# Patient Record
Sex: Female | Born: 1975 | Race: White | Hispanic: No | Marital: Married
Health system: Southern US, Community
[De-identification: ages and names within clinical notes are randomized; demographics above are authoritative.]

---

## 2008-08-20 ENCOUNTER — Ambulatory Visit: Payer: Self-pay | Admitting: Occupational Medicine

## 2008-08-20 DIAGNOSIS — S63509A Unspecified sprain of unspecified wrist, initial encounter: Secondary | ICD-10-CM | POA: Insufficient documentation

## 2009-11-14 ENCOUNTER — Ambulatory Visit: Payer: Self-pay | Admitting: Family Medicine

## 2009-11-14 DIAGNOSIS — M7710 Lateral epicondylitis, unspecified elbow: Secondary | ICD-10-CM | POA: Insufficient documentation

## 2009-11-14 DIAGNOSIS — M25529 Pain in unspecified elbow: Secondary | ICD-10-CM | POA: Insufficient documentation

## 2009-11-14 DIAGNOSIS — M7700 Medial epicondylitis, unspecified elbow: Secondary | ICD-10-CM

## 2010-05-28 NOTE — Assessment & Plan Note (Signed)
Summary: R elbow, arm, hand, shoulder x 2 wks rm 3   Vital Signs:  Patient Profile:   35 Years Old Female CC:      R elbow,arm shoulder, hand pain x 2 wks Height:     67 inches Weight:      195 pounds O2 Sat:      100 % O2 treatment:    Room Air Temp:     97.6 degrees F oral Pulse rate:   69 / minute Pulse rhythm:   regular Resp:     16 per minute BP sitting:   134 / 88  (right arm) Cuff size:   regular  Pt. in pain?   yes  Vitals Entered By: Areta Haber CMA (November 14, 2009 1:33 PM)                   Current Allergies: No known allergies History of Present Illness Chief Complaint: R elbow,arm shoulder, hand pain x 2 wks History of Present Illness:  Subjective:  Patient complains of falling on her right elbow two months ago.  She had no pain initially, but over the past two weeks has had increasing pain in the right elbow.  She uses a keyboard and mouse, and now has pain with this activity despite wearing a tennis elbow strap.  The pain awakens her at night.  Current Problems: MEDIAL EPICONDYLITIS, RIGHT (ICD-726.31) LATERAL EPICONDYLITIS, RIGHT (ICD-726.32) ELBOW PAIN, RIGHT (ICD-719.42) SPRAIN AND STRAIN OF UNSPECIFIED SITE OF WRIST (ICD-842.00) FAMILY HISTORY DIABETES 1ST DEGREE RELATIVE (ICD-V18.0)   Current Meds * NONE  IBUPROFEN 200 MG TABS (IBUPROFEN) as needed for pain NAPROXEN 500 MG TABS (NAPROXEN) One by mouth two times a day pc LORTAB 5 5-500 MG TABS (HYDROCODONE-ACETAMINOPHEN) One or two tabs by mouth hs as needed pain  REVIEW OF SYSTEMS Constitutional Symptoms      Denies fever, chills, night sweats, weight loss, weight gain, and fatigue.  Eyes       Denies change in vision, eye pain, eye discharge, glasses, contact lenses, and eye surgery. Ear/Nose/Throat/Mouth       Denies hearing loss/aids, change in hearing, ear pain, ear discharge, dizziness, frequent runny nose, frequent nose bleeds, sinus problems, sore throat, hoarseness, and tooth pain  or bleeding.  Respiratory       Denies dry cough, productive cough, wheezing, shortness of breath, asthma, bronchitis, and emphysema/COPD.  Cardiovascular       Denies murmurs, chest pain, and tires easily with exhertion.    Gastrointestinal       Denies stomach pain, nausea/vomiting, diarrhea, constipation, blood in bowel movements, and indigestion. Genitourniary       Denies painful urination, kidney stones, and loss of urinary control. Neurological       Denies paralysis, seizures, and fainting/blackouts. Musculoskeletal       Denies muscle pain, joint pain, joint stiffness, decreased range of motion, redness, swelling, muscle weakness, and gout.      Comments: R elbow, arm, shoulder, hand pain x 2 wks Skin       Denies bruising, unusual mles/lumps or sores, and hair/skin or nail changes.  Psych       Denies mood changes, temper/anger issues, anxiety/stress, speech problems, depression, and sleep problems. Other Comments: Pt states she can not get into Orthopedist x 2wks and needs something for pain. Pt states she fell in daycare parking lot x 2 months but never went to be seen for anything.    Past History:  Past Medical History:  Last updated: 08/20/2008 ovarian cyst  Past Surgical History: Last updated: 08/20/2008 Caesarean section  Family History: Last updated: 08/20/2008 Family History Diabetes 1st degree relative  Social History: Last updated: 08/20/2008 Occupation:Operations manager Never Smoked Alcohol use-no Drug use-no  Risk Factors: Smoking Status: never (08/20/2008)   Objective:  No acute distress  Right elbow:  No swelling or deformity.  Full range of motion.  There is tenderness over both medial and lateral epicondyles, and tenderness over insertion biceps tendon posteriorly.  Distal neurovascular intact  X-ray right elbow:  negative Assessment New Problems: MEDIAL EPICONDYLITIS, RIGHT (ICD-726.31) LATERAL EPICONDYLITIS, RIGHT (ICD-726.32) ELBOW  PAIN, RIGHT (ICD-719.42)  ALSO HAS RIGHT TRICEPS TENDONITIS   Plan New Medications/Changes: LORTAB 5 5-500 MG TABS (HYDROCODONE-ACETAMINOPHEN) One or two tabs by mouth hs as needed pain  #10 (ten) x 0, 11/14/2009, Donna Christen MD NAPROXEN 500 MG TABS (NAPROXEN) One by mouth two times a day pc  #20 x 1, 11/14/2009, Donna Christen MD  New Orders: T-DG Elbow Complete*R* [73080] Est. Patient Level IV [28413] Planning Comments:   Recommend a tennis elbow strap and similar band above elbow daytime.  Begin ice packs.  Begin Naproxen, and analgesic at bedtime.  Begin stretching exercises.  ([facility] information and instruction patient handout given)  Follow-up with [facility] if not improving 2 to 3 weeks.   The patient and/or caregiver has been counseled thoroughly with regard to medications prescribed including dosage, schedule, interactions, rationale for use, and possible side effects and they verbalize understanding.  Diagnoses and expected course of recovery discussed and will return if not improved as expected or if the condition worsens. Patient and/or caregiver verbalized understanding.  Prescriptions: LORTAB 5 5-500 MG TABS (HYDROCODONE-ACETAMINOPHEN) One or two tabs by mouth hs as needed pain  #10 (ten) x 0   Entered and Authorized by:   Donna Christen MD   Signed by:   Donna Christen MD on 11/14/2009   Method used:   Print then Give to Patient   RxID:   [id number] NAPROXEN 500 MG TABS (NAPROXEN) One by mouth two times a day pc  #20 x 1   Entered and Authorized by:   Donna Christen MD   Signed by:   Donna Christen MD on 11/14/2009   Method used:   Print then Give to Patient   RxID:   [id number]   Orders Added: 1)  T-DG Elbow Complete*R* [73080] 2)  Est. Patient Level IV [32951]

## 2010-11-14 ENCOUNTER — Other Ambulatory Visit: Payer: Self-pay | Admitting: Family Medicine

## 2010-11-14 DIAGNOSIS — Z1231 Encounter for screening mammogram for malignant neoplasm of breast: Secondary | ICD-10-CM

## 2010-11-19 ENCOUNTER — Ambulatory Visit
Admission: RE | Admit: 2010-11-19 | Discharge: 2010-11-19 | Disposition: A | Discharge status: Home / Self Care | Payer: BC Managed Care – PPO | Source: Ambulatory Visit | Attending: Family Medicine | Admitting: Family Medicine

## 2010-11-19 DIAGNOSIS — Z1231 Encounter for screening mammogram for malignant neoplasm of breast: Secondary | ICD-10-CM

## 2011-07-09 IMAGING — CR DG ELBOW COMPLETE 3+V*R*
4 series · 4 of 4 positions shown · non-contrast
Comparison: None.

CLINICAL DATA: Elbow pain after fall.

RIGHT ELBOW - COMPLETE 3+ VIEW

[view not recorded (1 of 4)]
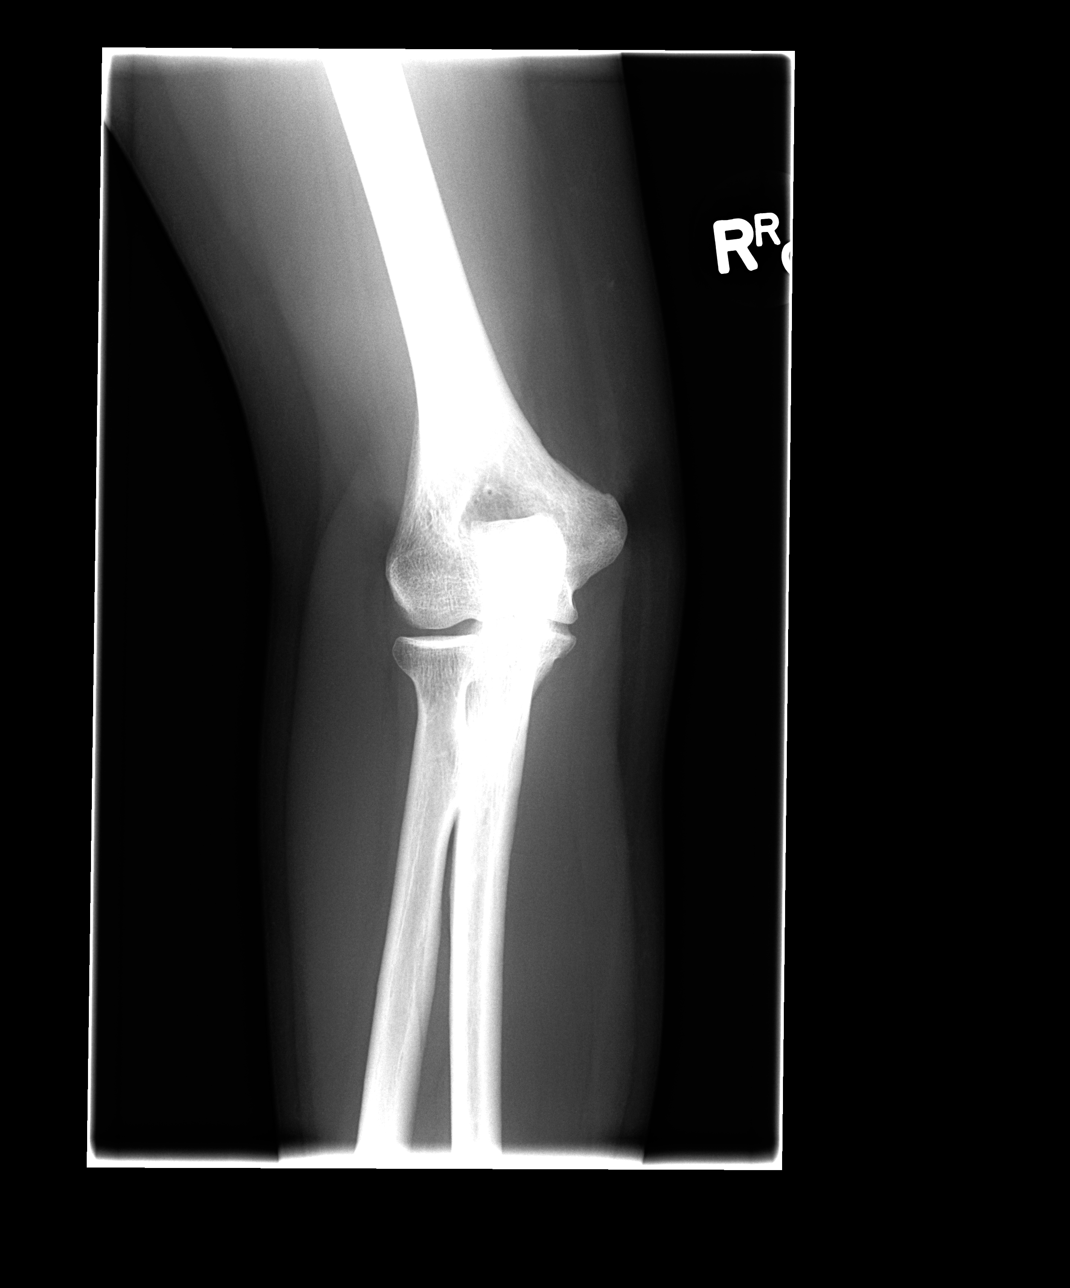

[view not recorded (2 of 4)]
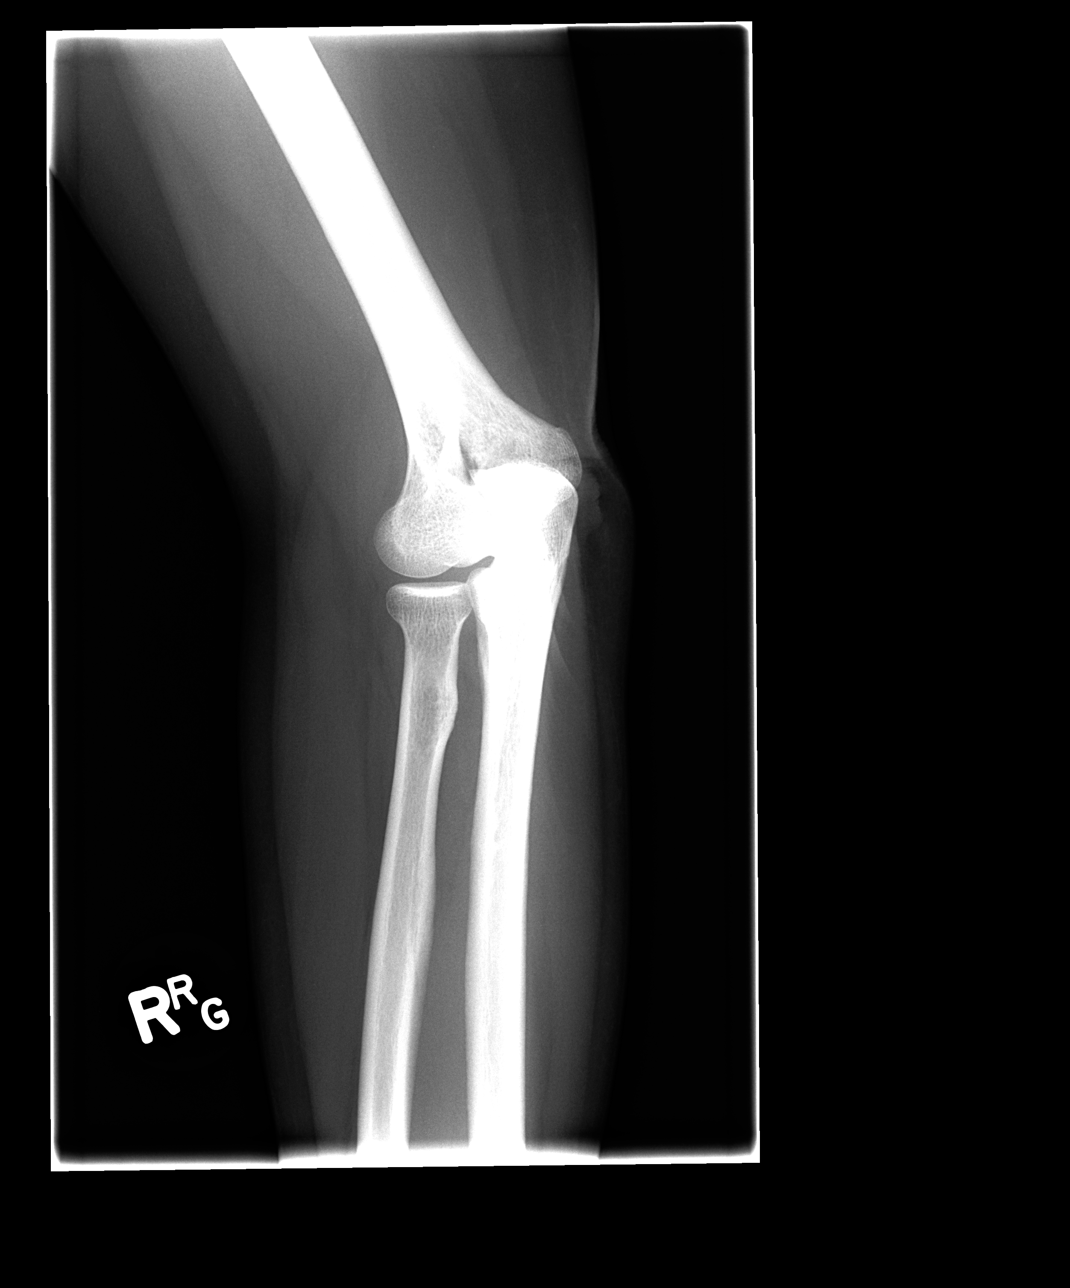

[view not recorded (3 of 4)]
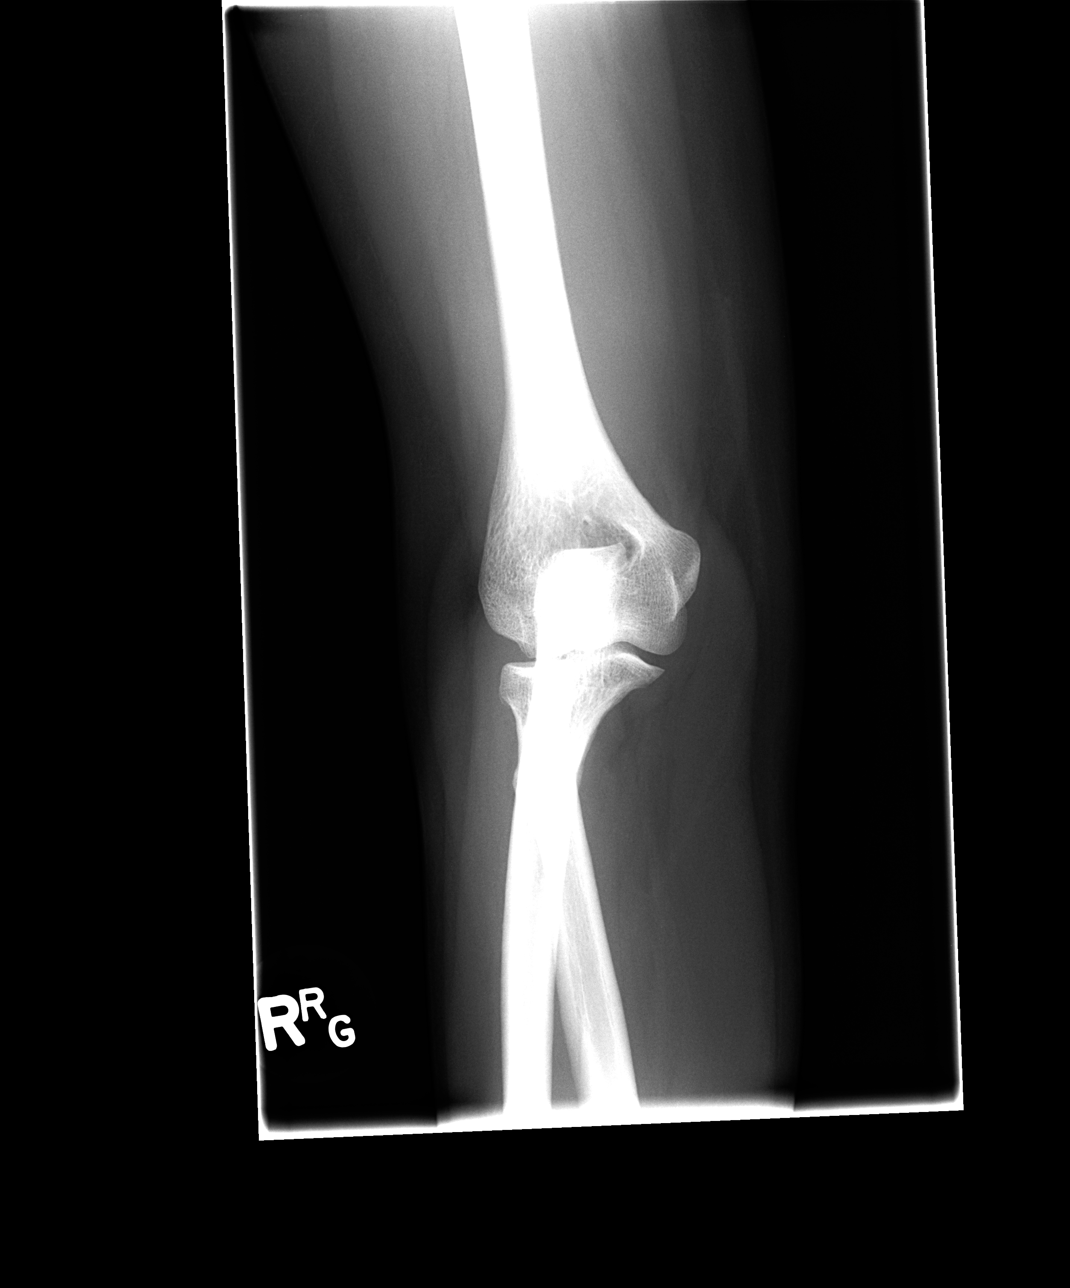

[view not recorded (4 of 4)]
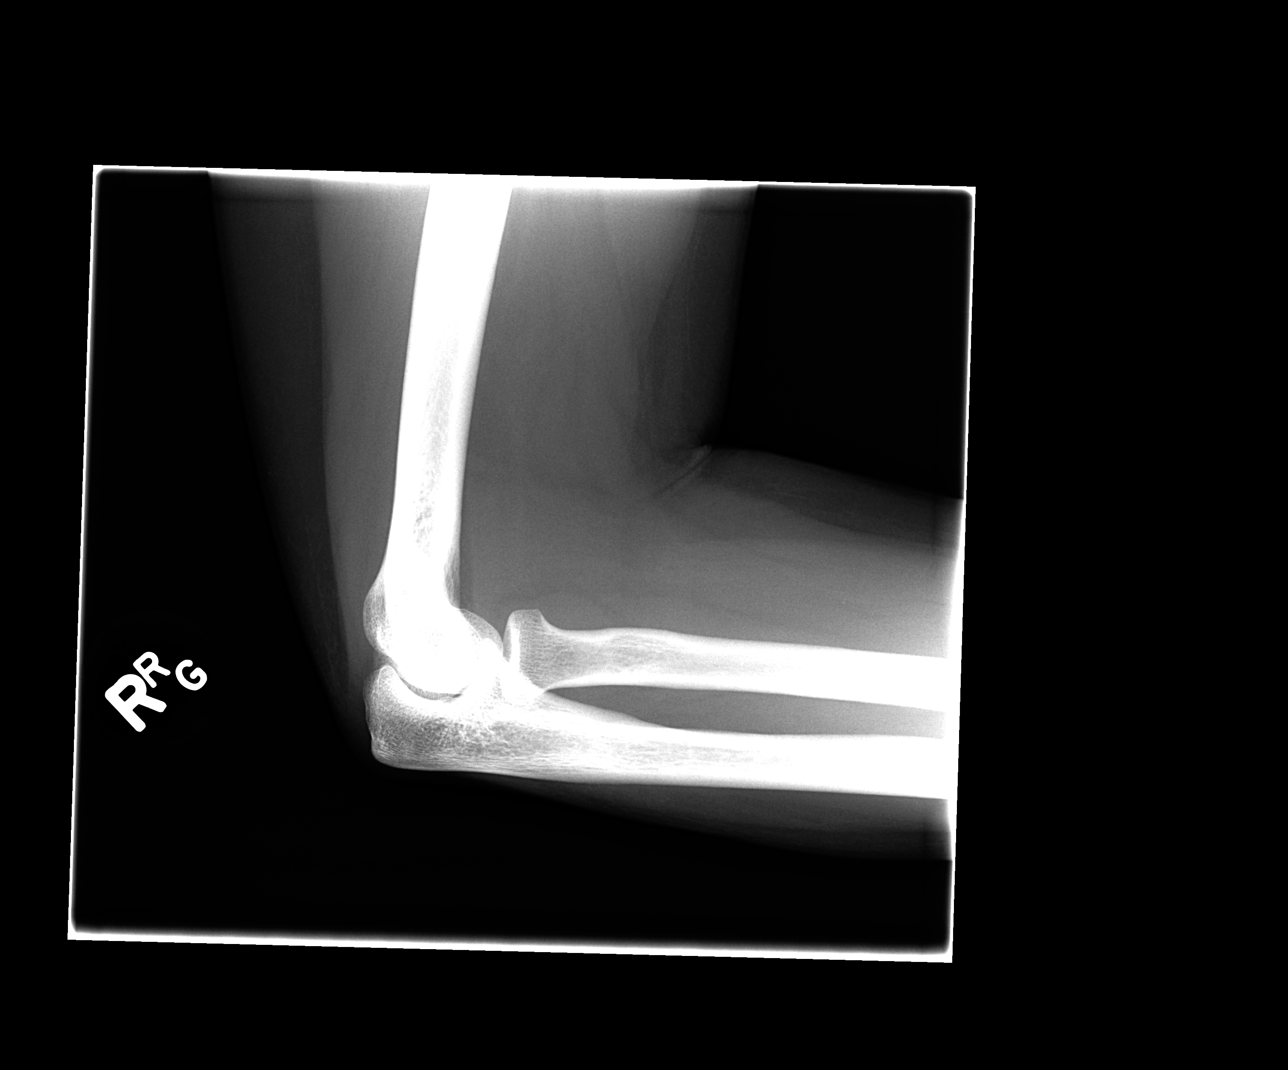

[4 of 4 positions shown; findings below may reference images not displayed]

FINDINGS: No evidence of acute or healing fracture.
IMPRESSION: No evidence of acute or healing fracture.

## 2015-03-28 ENCOUNTER — Emergency Department
Admission: EM | Admit: 2015-03-28 | Discharge: 2015-03-28 | Disposition: A | Discharge status: Home / Self Care | Payer: BLUE CROSS/BLUE SHIELD | Source: Home / Self Care | Attending: Family Medicine | Admitting: Family Medicine

## 2015-03-28 ENCOUNTER — Encounter: Payer: Self-pay | Admitting: Emergency Medicine

## 2015-03-28 DIAGNOSIS — J069 Acute upper respiratory infection, unspecified: Secondary | ICD-10-CM | POA: Diagnosis not present

## 2015-03-28 DIAGNOSIS — J020 Streptococcal pharyngitis: Secondary | ICD-10-CM

## 2015-03-28 LAB — POCT RAPID STREP A (OFFICE): RAPID STREP A SCREEN: POSITIVE — AB

## 2015-03-28 MED ORDER — AZITHROMYCIN 250 MG PO TABS: ORAL_TABLET | ORAL | Status: AC

## 2015-03-28 MED ORDER — BENZONATATE 200 MG PO CAPS: 200.0000 mg | ORAL_CAPSULE | Freq: Every day | ORAL | Status: AC

## 2015-03-28 NOTE — ED Notes (Signed)
Cough x 1 week,  Worse yesterday with yellow mucus, sore throat, fever, ears hurt. Was treated for strep 2 weeks ago.

## 2015-03-28 NOTE — ED Notes (Signed)
3 regular strength Tylenol given @11 :30

## 2015-03-28 NOTE — Discharge Instructions (Signed)
Take plain guaifenesin (1200mg  extended release tabs such as Mucinex) twice daily, with plenty of water, for cough and congestion.  May add Pseudoephedrine (30mg , one or two every 4 to 6 hours) for sinus congestion.  Get adequate rest.   May use Afrin nasal spray (or generic oxymetazoline) twice daily for about 5 days and then discontinue.  Also recommend using saline nasal spray several times daily and saline nasal irrigation (AYR is a common brand).   Try warm salt water gargles for sore throat.  Stop all antihistamines for now, and other non-prescription cough/cold preparations. May take Ibuprofen 200mg , 4 tabs every 8 hours with food for sore throat, body aches, etc.

## 2015-03-28 NOTE — ED Provider Notes (Signed)
CSN: [id number]     Arrival date & time 03/28/15  1038 History   First MD Initiated Contact with Patient 03/28/15 1116     Chief Complaint  Patient presents with  . Cough      HPI Comments: Patient reports that she was treated for strep pharyngitis 3 weeks ago, and symptoms resolved. One week ago she developed a cough and sinus congestion.  Yesterday she developed ear pressure.  Today she developed a fever and recurrent sore throat.  Her cough has become increasingly non-productive and she often coughs until she gags. She does not remember her last Tdap.  The history is provided by the patient.    History reviewed. No pertinent past medical history. History reviewed. No pertinent past surgical history. Family History  Problem Relation Age of Onset  . Diabetes Mother    Social History  Substance Use Topics  . Smoking status: None  . Smokeless tobacco: None  . Alcohol Use: No   OB History    No data available     Review of Systems + sore throat + cough No pleuritic pain No wheezing + nasal congestion + post-nasal drainage No sinus pain/pressure No itchy/red eyes ? earache No hemoptysis No SOB + fever, + chills No nausea No vomiting No abdominal pain No diarrhea No urinary symptoms No skin rash + fatigue No myalgias + headache Used OTC meds without relief   Allergies  Review of patient's allergies indicates no known allergies.  Home Medications   Prior to Admission medications   Medication Sig Start Date End Date Taking? Authorizing Provider  ALPRAZolam Prudy Feeler) 0.25 MG tablet Take 0.25 mg by mouth at bedtime as needed for anxiety.   Yes Historical Provider, MD  azithromycin (ZITHROMAX Z-PAK) 250 MG tablet Take 2 tabs today; then begin one tab once daily for 4 more days. 03/28/15   Lattie Haw, MD  benzonatate (TESSALON) 200 MG capsule Take 1 capsule (200 mg total) by mouth at bedtime. Take as needed for cough 03/28/15   Lattie Haw, MD   Meds  Ordered and Administered this Visit  Medications - No data to display  BP 121/74 mmHg  Pulse 90  Temp(Src) 99.4 F (37.4 C) (Oral)  Ht  (1.702 m)  Wt 206 lb (93.441 kg)  BMI 32.26 kg/m2  SpO2 96% No data found.   Physical Exam Nursing notes and Vital Signs reviewed. Appearance:  Patient appears stated age, and in no acute distress.  Patient is obese (BMI 32.3) Eyes:  Pupils are equal, round, and reactive to light and accomodation.  Extraocular movement is intact.  Conjunctivae are not inflamed  Ears:  Canals normal.  Tympanic membranes normal.  Nose:  Mildly congested turbinates.  No sinus tenderness.   Pharynx:  Uvula erythematous Neck:  Supple.  Tender enlarged tonsillar nodes.  Tender enlarged posterior nodes are palpated bilaterally  Lungs:  Clear to auscultation.  Breath sounds are equal.  Moving air well. Heart:  Regular rate and rhythm without murmurs, rubs, or gallops.  Abdomen:  Nontender without masses or hepatosplenomegaly.  Bowel sounds are present.  No CVA or flank tenderness.  Extremities:  No edema.    Skin:  No rash present.   ED Course  Procedures  None    Labs Reviewed  POCT RAPID STREP A (OFFICE) - Abnormal; Notable for the following:    Rapid Strep A Screen Positive (*)    All other components within normal limits  MDM   1. Streptococcal sore throat   2. Acute upper respiratory infection; ?pertussis    Begin Z-pak to cover both strep and atypical organisms. Prescription written for Benzonatate [facility](Tessalon) to take at bedtime for night-time cough.  Take plain guaifenesin (1200mg  extended release tabs such as Mucinex) twice daily, with plenty of water, for cough and congestion.  May add Pseudoephedrine (30mg , one or two every 4 to 6 hours) for sinus congestion.  Get adequate rest.   May use Afrin nasal spray (or generic oxymetazoline) twice daily for about 5 days and then discontinue.  Also recommend using saline nasal spray several times daily and  saline nasal irrigation (AYR is a common brand).   Try warm salt water gargles for sore throat.  Stop all antihistamines for now, and other non-prescription cough/cold preparations. May take Ibuprofen 200mg , 4 tabs every 8 hours with food for sore throat, body aches, etc. Followup with Family Doctor in about 10 days to repeat throat culture.    Lattie HawStephen A Keundra Petrucelli, MD 03/28/15 [id number]

## 2015-03-31 ENCOUNTER — Telehealth: Payer: Self-pay
